# Patient Record
Sex: Male | Born: 1970 | Race: White | Hispanic: No | Marital: Married | Smoking: Never smoker
Health system: Southern US, Community
[De-identification: ages and names within clinical notes are randomized; demographics above are authoritative.]

## PROBLEM LIST (undated history)

## (undated) DIAGNOSIS — E119 Type 2 diabetes mellitus without complications: Secondary | ICD-10-CM

## (undated) HISTORY — PX: KNEE SURGERY: SHX244

## (undated) HISTORY — PX: BACK SURGERY: SHX140

---

## 2000-10-14 ENCOUNTER — Emergency Department (HOSPITAL_COMMUNITY): Admission: EM | Admit: 2000-10-14 | Discharge: 2000-10-14 | Payer: Self-pay | Admitting: Emergency Medicine

## 2000-10-14 ENCOUNTER — Encounter: Payer: Self-pay | Admitting: Emergency Medicine

## 2004-10-17 ENCOUNTER — Ambulatory Visit: Payer: Self-pay

## 2012-09-20 DEATH — deceased

## 2014-05-14 DIAGNOSIS — M12569 Traumatic arthropathy, unspecified knee: Secondary | ICD-10-CM | POA: Insufficient documentation

## 2014-05-14 DIAGNOSIS — S83249A Other tear of medial meniscus, current injury, unspecified knee, initial encounter: Secondary | ICD-10-CM | POA: Insufficient documentation

## 2014-05-24 ENCOUNTER — Ambulatory Visit: Payer: Self-pay | Admitting: Surgery

## 2014-06-20 ENCOUNTER — Ambulatory Visit
Admit: 2014-06-20 | Disposition: A | Discharge status: Home / Self Care | Payer: Self-pay | Attending: Surgery | Admitting: Surgery

## 2014-12-03 ENCOUNTER — Ambulatory Visit (INDEPENDENT_AMBULATORY_CARE_PROVIDER_SITE_OTHER): Payer: 59 | Admitting: Family Medicine

## 2014-12-03 ENCOUNTER — Encounter: Payer: Self-pay | Admitting: Family Medicine

## 2014-12-03 VITALS — BP 120/70 | HR 98 | Temp 98.5°F | Resp 16 | Ht 74.0 in | Wt 310.0 lb

## 2014-12-03 DIAGNOSIS — J40 Bronchitis, not specified as acute or chronic: Secondary | ICD-10-CM | POA: Diagnosis not present

## 2014-12-03 DIAGNOSIS — J301 Allergic rhinitis due to pollen: Secondary | ICD-10-CM

## 2014-12-03 DIAGNOSIS — R059 Cough, unspecified: Secondary | ICD-10-CM

## 2014-12-03 DIAGNOSIS — R05 Cough: Secondary | ICD-10-CM

## 2014-12-03 MED ORDER — AMOXICILLIN-POT CLAVULANATE 875-125 MG PO TABS: 1.0000 | ORAL_TABLET | Freq: Two times a day (BID) | ORAL | Status: DC

## 2014-12-03 MED ORDER — TRAMADOL HCL 50 MG PO TABS: 50.0000 mg | ORAL_TABLET | Freq: Three times a day (TID) | ORAL | Status: DC | PRN

## 2014-12-03 MED ORDER — PREDNISONE 20 MG PO TABS: 20.0000 mg | ORAL_TABLET | Freq: Every day | ORAL | Status: DC

## 2014-12-03 NOTE — Patient Instructions (Signed)

## 2014-12-03 NOTE — Progress Notes (Signed)
Name: Antonio Davies   MRN: [id number]    DOB: [birth year]   Date:12/03/2014       Progress Note  Subjective  Chief Complaint  Chief Complaint  Patient presents with  . Cough    for one week  . Headache    HPI   Bronchitis  Patient presents with a greater than 1 week history of cough productive of purulent sputum. The cough is irritating and keep the patient awake at night. There has no fever or chills.  Over-the-counter meds And completely effective.   Headache   Patient has had some headache associated with his symptomatology. In addition has been some mild myalgias and fatigue. Has been minimal nasal drainage but particularly at night. The cough has been keeping up at night awake at night. This been no hemoptysis no night sweats. No history of TB exposure    History reviewed. No pertinent past medical history.  Social History  Substance Use Topics  . Smoking status: Never Smoker   . Smokeless tobacco: Not on file  . Alcohol Use: 0.0 oz/week    0 Standard drinks or equivalent per week    No current outpatient prescriptions on file.  No Known Allergies  Review of Systems  Constitutional: Positive for fever. Negative for chills and weight loss.  HENT: Negative for congestion, hearing loss, sore throat and tinnitus.        Posterior pharyngeal drainage intermittently at night  Eyes: Negative for blurred vision, double vision and redness.  Respiratory: Positive for cough and sputum production (SLIGHTLY YELLOW AT TIMES). Negative for hemoptysis and shortness of breath.   Cardiovascular: Negative for chest pain, palpitations, orthopnea, claudication and leg swelling.  Gastrointestinal: Negative for heartburn, nausea, vomiting, diarrhea, constipation and blood in stool.  Genitourinary: Negative for dysuria, urgency, frequency and hematuria.  Musculoskeletal: Negative for myalgias, back pain, joint pain, falls and neck pain.  Skin: Negative for itching and rash.   Neurological: Negative for dizziness, tingling, tremors, focal weakness, seizures, loss of consciousness, weakness and headaches.  Endo/Heme/Allergies: Does not bruise/bleed easily.  Psychiatric/Behavioral: Negative for depression and substance abuse. The patient is not nervous/anxious and does not have insomnia.      Objective  Filed Vitals:   12/03/14 1341  BP: 120/70  Pulse: 98  Temp: 98.5 F (36.9 C)  Resp: 16  Height:  (1.88 m)  Weight: 310 lb (140.615 kg)  SpO2: 96%     Physical Exam  Constitutional: He is oriented to person, place, and time and well-developed, well-nourished, and in no distress.  Obese male in no acute distress for frequent coughing  HENT:  Head: Normocephalic.  Eyes: EOM are normal. Pupils are equal, round, and reactive to light.  Neck: Normal range of motion. Neck supple. No thyromegaly present.  Cardiovascular: Normal rate, regular rhythm and normal heart sounds.   No murmur heard. Pulmonary/Chest: Effort normal and breath sounds normal. No respiratory distress. He has no wheezes.  Musculoskeletal: Normal range of motion. He exhibits no edema.  Lymphadenopathy:    He has no cervical adenopathy.  Neurological: He is alert and oriented to person, place, and time. No cranial nerve deficit. Gait normal. Coordination normal.  Skin: Skin is warm and dry. No rash noted.  Psychiatric: Affect and judgment normal.      Assessment & Plan  1. Bronchitis Acute - amoxicillin-clavulanate (AUGMENTIN) 875-125 MG per tablet; Take 1 tablet by mouth 2 (two) times daily.  Dispense: 20 tablet; Refill: 0 - predniSONE (DELTASONE)  20 MG tablet; Take 1 tablet (20 mg total) by mouth daily with breakfast.  Dispense: 10 tablet; Refill: 0  2. Cough Persistent) severe  3. Allergic rhinitis due to pollen Use Flonase which she has at home

## 2014-12-04 ENCOUNTER — Telehealth: Payer: Self-pay

## 2014-12-04 NOTE — Telephone Encounter (Signed)
Pt was here yesterday and was dx with bronchitis. Dr. Thana Ates gave him Tramadol for the pain and told him it would make him drowsy and he would give him a work note if he needed one, he had someone who was available to drive him to work today but, that won't be the case for the rest of the week. He needs a work note for the rest of this week. Please fax it to [phone] saying  "NO OPERATING VEHICLE" and excusing him from work and once this is completed please call him  at [phone]. Thanks

## 2014-12-05 NOTE — Telephone Encounter (Signed)
Thanks

## 2014-12-05 NOTE — Telephone Encounter (Signed)
Note printed and faxed to number left by pt. Called number to inform pt but no answer so left vm.

## 2014-12-05 NOTE — Telephone Encounter (Signed)
ok 

## 2015-02-28 ENCOUNTER — Ambulatory Visit: Payer: Self-pay

## 2015-02-28 ENCOUNTER — Other Ambulatory Visit: Payer: Self-pay | Admitting: Occupational Medicine

## 2015-02-28 DIAGNOSIS — M25562 Pain in left knee: Secondary | ICD-10-CM

## 2016-03-20 IMAGING — MR MRI OF THE RIGHT KNEE WITHOUT CONTRAST
6 series · 35 of 40 positions shown · non-contrast
Comparison: None.

CLINICAL DATA: Right knee pain and swelling.

EXAM:
MRI OF THE RIGHT KNEE WITHOUT CONTRAST
TECHNIQUE: Multiplanar, multisequence MR imaging of the knee was performed. No
intravenous contrast was administered.

[Series 3: PD fat-sat · axial · 3.0mm · 0.50mm/px · z∈[-80,+42]mm · 6 of 38 slices shown (1 of 4)]
[im 1/38]
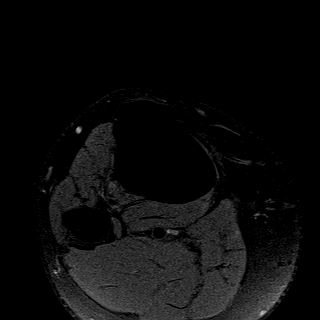
[im 8/38]
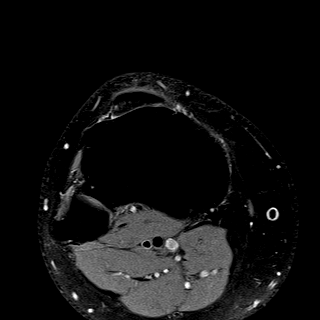
[im 15/38]
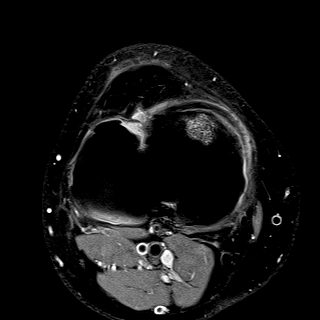
[im 23/38]
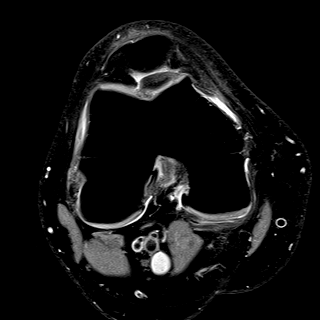
[im 30/38]
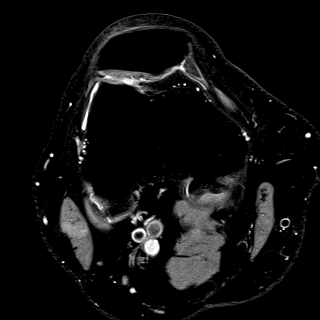
[im 38/38]
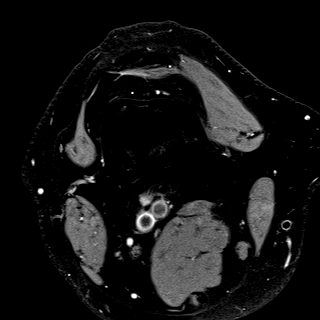

[Series 4: PD fat-sat · sagittal · 3.0mm · 0.50mm/px · 7 of 35 slices shown (2 of 4)]
[im 1/35]
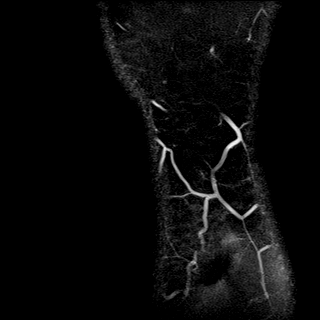
[im 6/35]
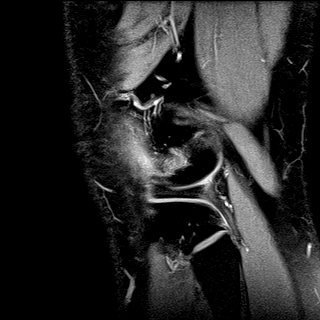
[im 12/35]
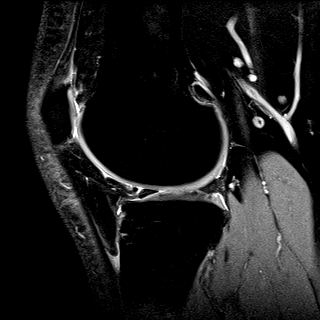
[im 18/35]
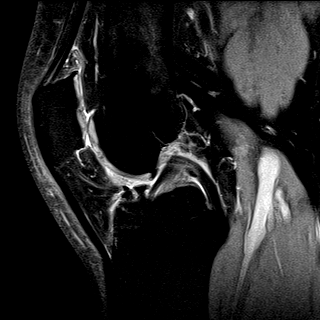
[im 23/35]
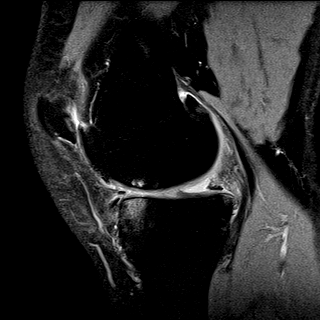
[im 29/35]
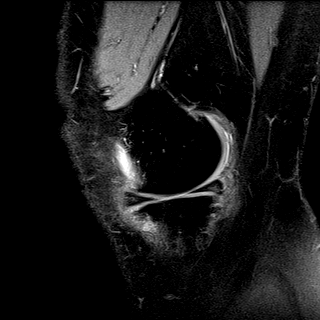
[im 35/35]
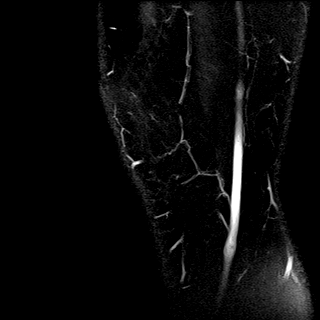

[Series 5: T1 · coronal · 3.0mm · 0.50mm/px · 3 of 39 slices shown]
[im 1/39]
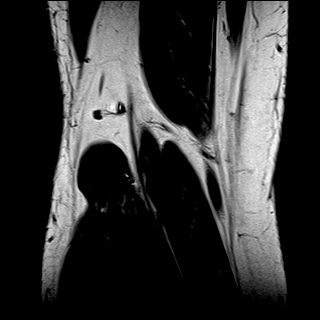
[im 6/39]
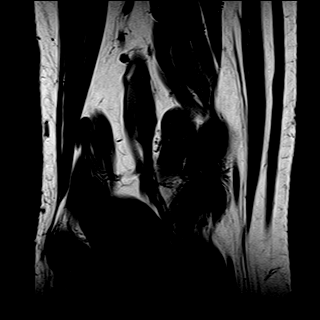
[im 11/39]
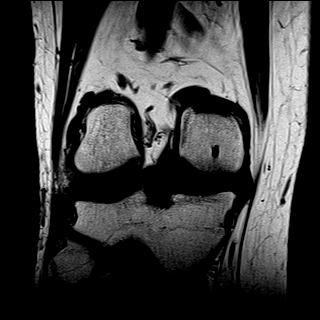

[Series 6: T2 fat-sat · coronal · 3.0mm · 0.31mm/px · 8 of 39 slices shown]
[im 1/39]
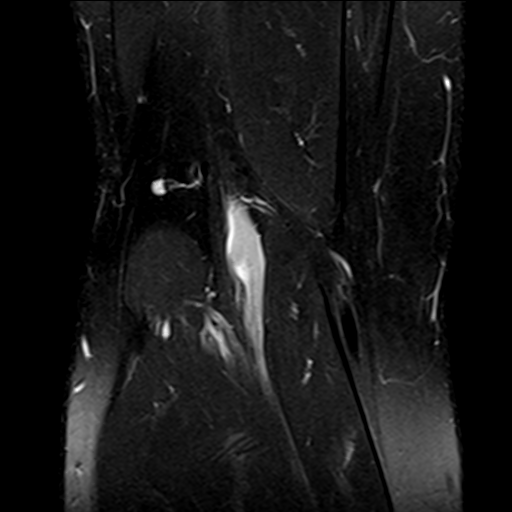
[im 6/39]
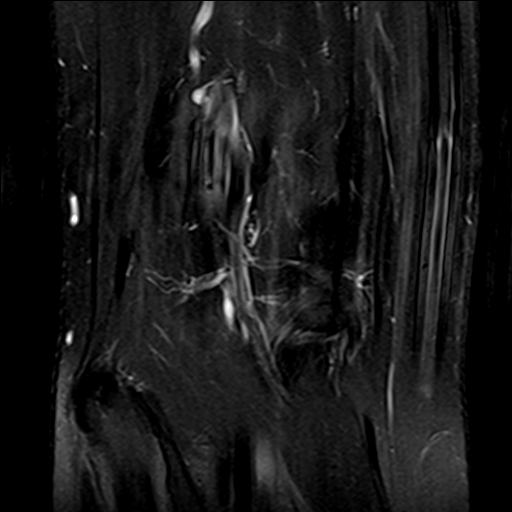
[im 11/39]
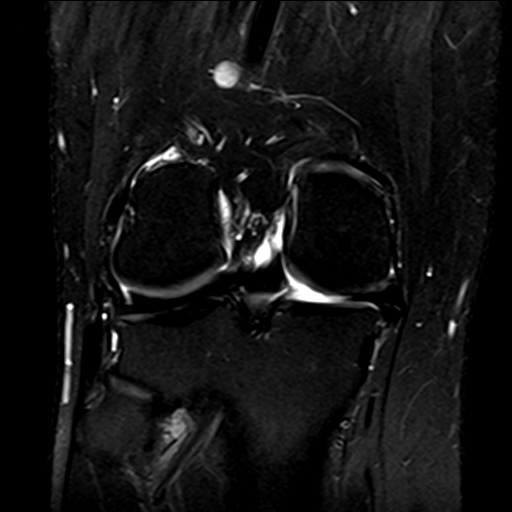
[im 17/39]
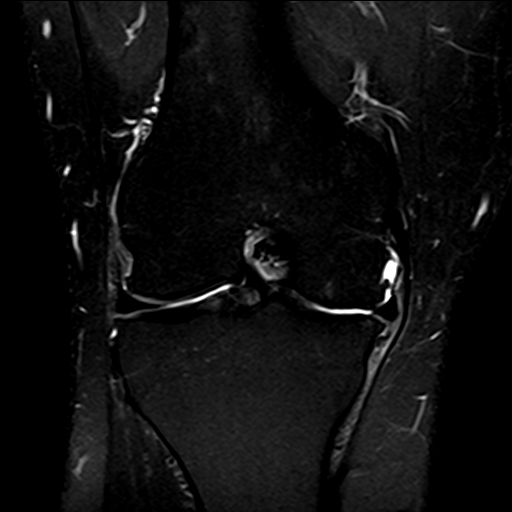
[im 22/39]
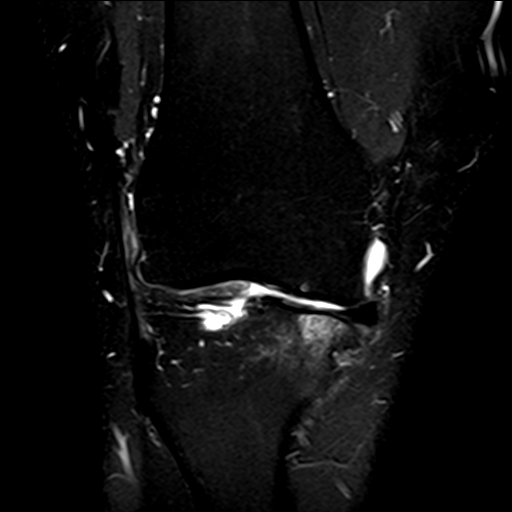
[im 28/39]
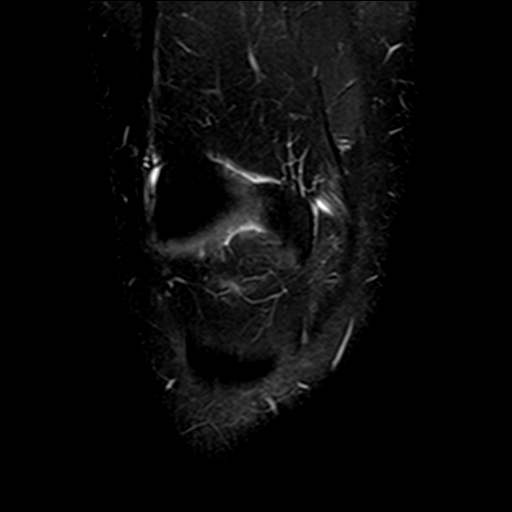
[im 33/39]
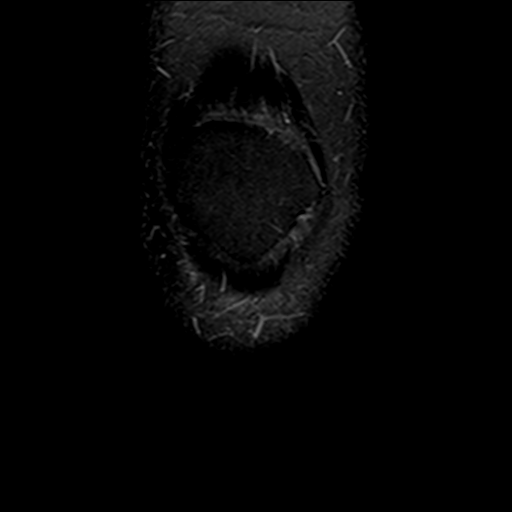
[im 39/39]
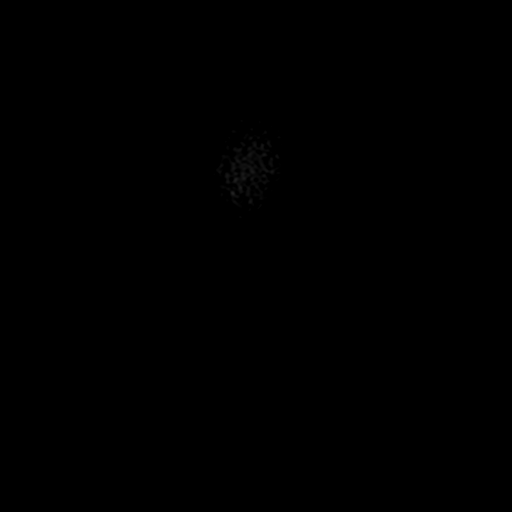

[Series 7: PD fat-sat · coronal · 3.0mm · 0.50mm/px · 8 of 39 slices shown (3 of 4)]
[im 1/39]
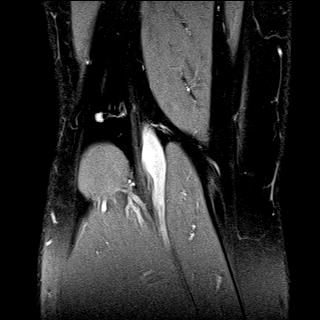
[im 6/39]
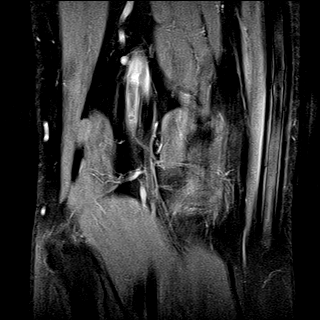
[im 11/39]
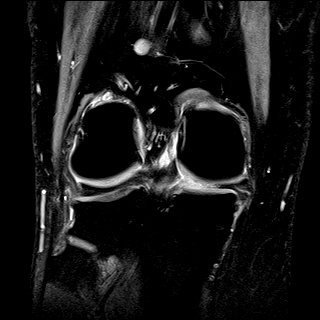
[im 17/39]
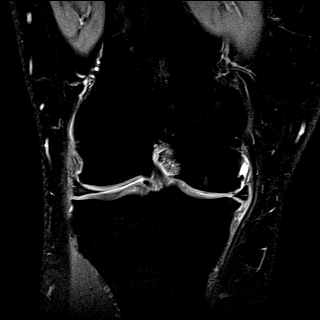
[im 22/39]
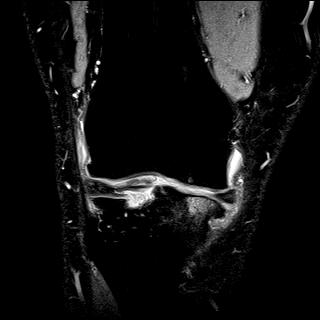
[im 28/39]
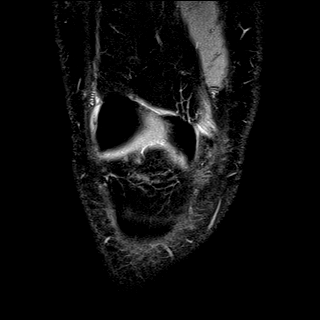
[im 33/39]
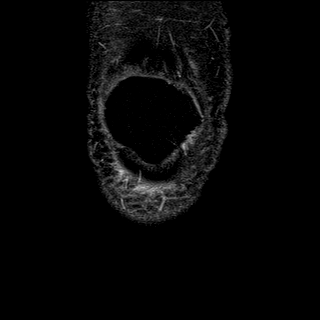
[im 39/39]
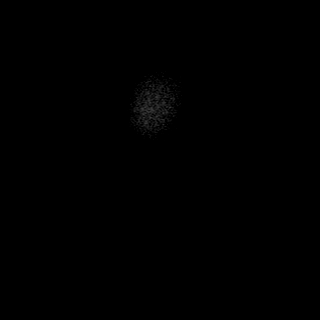

[Series 8: PD fat-sat · coronal · 2.0mm · 0.62mm/px · 3 of 13 slices shown (4 of 4)]
[im 1/13]
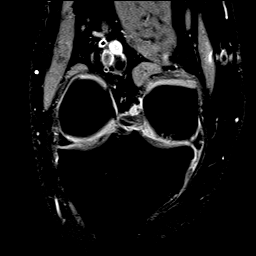
[im 7/13]
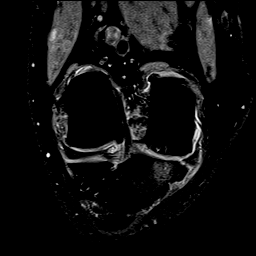
[im 13/13]
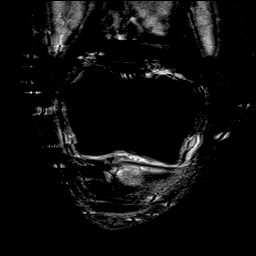

[35 of 40 positions shown; findings below may reference images not displayed]

FINDINGS: MENISCI

Medial meniscus: There is intrinsic degeneration of the midbody and
posterior horn with peripheral subluxation of the meniscus due to
medial joint space narrowing. There is small displaced flap tear at
the meniscal root, best seen on images 21 of series 3, image 10 of
series 7, and images 22 through 24 of series 4.

Lateral meniscus:  Normal.

LIGAMENTS

Cruciates:  Normal.

Collaterals:  Normal.

CARTILAGE

Patellofemoral: Grade 2-3 chondromalacia of the apex and medial
facet of the patella and of the medial and central portions of the
trochlear groove of the distal femur.

Medial: Extensive partial and full-thickness cartilage loss of the
femoral condyle and tibial plateau, most severe at the anterior
aspect of the tibial plateau where there subcortical edema. Multiple
foci of grade 4 chondromalacia of the femoral condyles.

Lateral:  Normal.

Joint:  Normal.  Physiologic amount of joint fluid.

Popliteal Fossa:  Normal.

Extensor Mechanism:  Normal.

Bones: Tricompartmental marginal osteophytes, most prominent in the
medial compartment.
IMPRESSION: Moderate osteoarthritis of the medial compartment with joint space
narrowing and subluxation of the meniscus.

Small displaced flap tear of the root of the posterior horn of the
medial meniscus.

Chondromalacia of the patellofemoral compartment.

## 2017-01-13 ENCOUNTER — Encounter: Payer: 59 | Admitting: Family Medicine

## 2017-03-08 DIAGNOSIS — R079 Chest pain, unspecified: Secondary | ICD-10-CM

## 2017-03-08 DIAGNOSIS — R0789 Other chest pain: Secondary | ICD-10-CM

## 2019-06-02 ENCOUNTER — Other Ambulatory Visit: Payer: Self-pay

## 2019-06-02 ENCOUNTER — Ambulatory Visit: Payer: 59 | Attending: Internal Medicine

## 2019-06-02 DIAGNOSIS — Z23 Encounter for immunization: Secondary | ICD-10-CM

## 2019-06-02 NOTE — Progress Notes (Signed)
   Covid-19 Vaccination Clinic  Name:  Antonio Davies    MRN: [id number] DOB: 11/20/1970  06/02/2019  Mr. [name] was observed post Covid-19 immunization for 15 minutes without incident. He was provided with Vaccine Information Sheet and instruction to access the V-Safe system.   Mr. [name] was instructed to call 911 with any severe reactions post vaccine: Marland Kitchen Difficulty breathing  . Swelling of face and throat  . A fast heartbeat  . A bad rash all over body  . Dizziness and weakness   Immunizations Administered    Name Date Dose VIS Date Route   Pfizer COVID-19 Vaccine 06/02/2019  8:41 AM 0.3 mL 03/03/2019 Intramuscular   Manufacturer: [employer], [employer]   Lot: [id number]   NDC: [address]-2

## 2019-06-27 ENCOUNTER — Ambulatory Visit: Payer: 59 | Attending: Internal Medicine

## 2019-06-27 DIAGNOSIS — Z23 Encounter for immunization: Secondary | ICD-10-CM

## 2019-06-27 NOTE — Progress Notes (Signed)
   Covid-19 Vaccination Clinic  Name:  Antonio Davies    MRN: [id number] DOB: January 07, 1971  06/27/2019  Mr. Antonio Davies was observed post Covid-19 immunization for 15 minutes without incident. He was provided with Vaccine Information Sheet and instruction to access the V-Safe system.   Mr. Antonio Davies was instructed to call 911 with any severe reactions post vaccine: Marland Kitchen Difficulty breathing  . Swelling of face and throat  . A fast heartbeat  . A bad rash all over body  . Dizziness and weakness   Immunizations Administered    Name Date Dose VIS Date Route   Pfizer COVID-19 Vaccine 06/27/2019  1:35 PM 0.3 mL 03/03/2019 Intramuscular   Manufacturer: [employer], [employer]   Lot: [id number]   NDC: [address]-3

## 2022-01-20 ENCOUNTER — Other Ambulatory Visit: Payer: Self-pay

## 2022-01-20 ENCOUNTER — Encounter (HOSPITAL_COMMUNITY): Payer: Self-pay | Admitting: *Deleted

## 2022-01-20 ENCOUNTER — Emergency Department (HOSPITAL_COMMUNITY)
Admission: EM | Admit: 2022-01-20 | Discharge: 2022-01-20 | Disposition: A | Discharge status: Home / Self Care | Payer: Worker's Compensation | Attending: Emergency Medicine | Admitting: Emergency Medicine

## 2022-01-20 ENCOUNTER — Emergency Department (HOSPITAL_COMMUNITY): Payer: Worker's Compensation

## 2022-01-20 DIAGNOSIS — S060X0A Concussion without loss of consciousness, initial encounter: Secondary | ICD-10-CM | POA: Insufficient documentation

## 2022-01-20 DIAGNOSIS — X501XXA Overexertion from prolonged static or awkward postures, initial encounter: Secondary | ICD-10-CM | POA: Insufficient documentation

## 2022-01-20 DIAGNOSIS — W01198A Fall on same level from slipping, tripping and stumbling with subsequent striking against other object, initial encounter: Secondary | ICD-10-CM | POA: Diagnosis not present

## 2022-01-20 DIAGNOSIS — E119 Type 2 diabetes mellitus without complications: Secondary | ICD-10-CM | POA: Diagnosis not present

## 2022-01-20 DIAGNOSIS — M25561 Pain in right knee: Secondary | ICD-10-CM | POA: Diagnosis not present

## 2022-01-20 DIAGNOSIS — S8391XA Sprain of unspecified site of right knee, initial encounter: Secondary | ICD-10-CM | POA: Diagnosis not present

## 2022-01-20 DIAGNOSIS — Y990 Civilian activity done for income or pay: Secondary | ICD-10-CM | POA: Insufficient documentation

## 2022-01-20 DIAGNOSIS — W19XXXA Unspecified fall, initial encounter: Secondary | ICD-10-CM

## 2022-01-20 DIAGNOSIS — R519 Headache, unspecified: Secondary | ICD-10-CM | POA: Diagnosis present

## 2022-01-20 DIAGNOSIS — S0990XA Unspecified injury of head, initial encounter: Secondary | ICD-10-CM

## 2022-01-20 HISTORY — DX: Type 2 diabetes mellitus without complications: E11.9

## 2022-01-20 MED ORDER — IBUPROFEN 600 MG PO TABS: 600.0000 mg | ORAL_TABLET | Freq: Four times a day (QID) | ORAL | 0 refills | Status: DC | PRN

## 2022-01-20 MED ORDER — HYDROCODONE-ACETAMINOPHEN 5-325 MG PO TABS: 1.0000 | ORAL_TABLET | ORAL | 0 refills | Status: DC | PRN

## 2022-01-20 MED ORDER — ACETAMINOPHEN 500 MG PO TABS
1000.0000 mg | ORAL_TABLET | Freq: Once | ORAL | Status: AC
  Administered 2022-01-20: 1000 mg via ORAL
  Filled 2022-01-20: qty 2

## 2022-01-20 NOTE — ED Notes (Signed)
This RN spoke with the pt re: workman's comp, pt does not have paperwork from his place of employment, this RN discussed with the MD re: the pts inquiry about this process and the MD stated that he would need to contact his place of employment for information re: requirements, pt updated and verbalizes understanding

## 2022-01-20 NOTE — ED Triage Notes (Signed)
Pt in via [facility] from work site with report that the pt fell on some grease and hit his head, denies LOC, c/o HA, pt c/o R knee pain, A&O x4, denies taking blood thinners, pt has DM II with CBG 244 pta

## 2022-01-20 NOTE — ED Provider Notes (Signed)
[facility] Provider Note   CSN: [id number] Arrival date & time: 01/20/22  4235     History  Chief Complaint  Patient presents with   Lytle Michaels    JORIEL STREETY is a 51 y.o. male.  Pt is a 51 yo male with a pmhx significant for DM.  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  He did hit the back of his head.  No LOC, but he has a bad headache.  He also twisted his right knee and that is hurting.  Pt was able to walk, but it hurt his right knee.  He is not on blood thinners.  He took ibuprofen pta.  No other injuries.       Home Medications Prior to Admission medications   Medication Sig Start Date End Date Taking? Authorizing Provider  HYDROcodone-acetaminophen (NORCO/VICODIN) 5-325 MG tablet Take 1 tablet by mouth every 4 (four) hours as needed. 01/20/22  Yes Isla Pence, MD  ibuprofen (ADVIL) 600 MG tablet Take 1 tablet (600 mg total) by mouth every 6 (six) hours as needed. 01/20/22  Yes Isla Pence, MD  amoxicillin-clavulanate (AUGMENTIN) 875-125 MG per tablet Take 1 tablet by mouth 2 (two) times daily. 12/03/14   Ashok Norris, MD  predniSONE (DELTASONE) 20 MG tablet Take 1 tablet (20 mg total) by mouth daily with breakfast. 12/03/14   Ashok Norris, MD  traMADol (ULTRAM) 50 MG tablet Take 1 tablet (50 mg total) by mouth every 8 (eight) hours as needed. 12/03/14   Ashok Norris, MD      Allergies    Patient has no known allergies.    Review of Systems   Review of Systems  Musculoskeletal:        Right knee pain  Neurological:  Positive for headaches.  All other systems reviewed and are negative.   Physical Exam Updated Vital Signs BP 138/85 (BP Location: Left Arm)   Pulse 86   Temp 98.1 F (36.7 C) (Oral)   Resp 15   Ht 6\' 3"  (1.905 m)   SpO2 95%   BMI 38.75 kg/m  Physical Exam Vitals and nursing note reviewed.  Constitutional:      Appearance: Normal appearance.  HENT:     Head:  Normocephalic and atraumatic.     Right Ear: External ear normal.     Left Ear: External ear normal.     Nose: Nose normal.     Mouth/Throat:     Mouth: Mucous membranes are moist.     Pharynx: Oropharynx is clear.  Eyes:     Extraocular Movements: Extraocular movements intact.     Conjunctiva/sclera: Conjunctivae normal.     Pupils: Pupils are equal, round, and reactive to light.  Cardiovascular:     Rate and Rhythm: Normal rate and regular rhythm.     Pulses: Normal pulses.     Heart sounds: Normal heart sounds.  Pulmonary:     Effort: Pulmonary effort is normal.     Breath sounds: Normal breath sounds.  Abdominal:     General: Abdomen is flat. Bowel sounds are normal.     Palpations: Abdomen is soft.  Musculoskeletal:     Cervical back: Normal range of motion.       Legs:  Skin:    General: Skin is warm.     Capillary Refill: Capillary refill takes less than 2 seconds.  Neurological:     General: No focal deficit present.  Mental Status: He is alert and oriented to person, place, and time.  Psychiatric:        Mood and Affect: Mood normal.        Behavior: Behavior normal.     ED Results / Procedures / Treatments   Labs (all labs ordered are listed, but only abnormal results are displayed) Labs Reviewed - No data to display  EKG None  Radiology CT Head Wo Contrast  Result Date: 01/20/2022 CLINICAL DATA:  Headache after fall. EXAM: CT HEAD WITHOUT CONTRAST TECHNIQUE: Contiguous axial images were obtained from the base of the skull through the vertex without intravenous contrast. RADIATION DOSE REDUCTION: This exam was performed according to the departmental dose-optimization program which includes automated exposure control, adjustment of the mA and/or kV according to patient size and/or use of iterative reconstruction technique. COMPARISON:  None Available. FINDINGS: Brain: No evidence of acute infarction, hemorrhage, hydrocephalus, extra-axial collection or  mass lesion/mass effect. Vascular: No hyperdense vessel or unexpected calcification. Skull: Normal. Negative for fracture or focal lesion. Sinuses/Orbits: No acute finding. Other: None. IMPRESSION: 1. No acute intracranial abnormality. Electronically Signed   By: Obie Dredge M.D.   On: 01/20/2022 08:11   DG Knee Complete 4 Views Right  Result Date: 01/20/2022 CLINICAL DATA:  Post fall this morning, now with knee pain. EXAM: RIGHT KNEE - COMPLETE 4+ VIEW COMPARISON:  None Available. FINDINGS: No fracture or dislocation. No joint effusion or evidence of lipohemarthrosis. 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 There is minimal spurring of the tibial spines. No evidence of chondrocalcinosis. Tiny (2 mm) peripherally corticated ossicle adjacent to the anterior aspect of the tibial tuberosity likely represents sequela of remote avulsive injury. Regional soft tissues appear normal. IMPRESSION: 1. No acute findings. 2. 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 Electronically Signed   By: Simonne Come M.D.   On: 01/20/2022 07:43    Procedures Procedures    Medications Ordered in ED Medications - No data to display  ED Course/ Medical Decision Making/ A&P                           Medical Decision Making Amount and/or Complexity of Data Reviewed Radiology: ordered.  Risk Prescription drug management.   This patient presents to the ED for concern of fall, this involves an extensive number of treatment options, and is a complaint that carries with it a high risk of complications and morbidity.  The differential diagnosis includes multiple trauma   Co morbidities that complicate the patient evaluation  DM   Additional history obtained:  Additional history obtained from epic chart  review External records from outside source obtained and reviewed including EMS report   Imaging Studies ordered:  I ordered imaging studies including CT head and right knee x-ray  I independently visualized and interpreted imaging which showed  R knee: IMPRESSION:  1. No acute findings.  2. Moderate to severe tricompartmental degenerative change of the  knee, worse within the medial compartment.  CT head: IMPRESSION:  1. No acute intracranial abnormality.   I agree with the radiologist interpretation   Cardiac Monitoring:  The patient was maintained on a cardiac monitor.  I personally viewed and interpreted the cardiac monitored which showed an underlying rhythm of: nsr   Medicines ordered and prescription drug management:  Pt did not want additional meds while here. I have reviewed the patients home medicines and have made adjustments as needed   Test Considered:  ct   Critical Interventions:  ct    Problem List / ED Course:  R knee pain:  pt placed in a knee immobilizer for comfort.  He has an orthopedist and is to f/u with him. Head injury:  Mild concussion.  No internal injury.  Pt given the number to the concussion clinic.   Reevaluation:  After the interventions noted above, I reevaluated the patient and found that they have :improved   Social Determinants of Health:  Lives at home with wife    Dispostion:  After consideration of the diagnostic results and the patients response to treatment, I feel that the patent would benefit from discharge with outpatient f/u.          Final Clinical Impression(s) / ED Diagnoses Final diagnoses:  Fall, initial encounter  Sprain of right knee, unspecified ligament, initial encounter  Minor head injury, initial encounter    Rx / DC Orders ED Discharge Orders          Ordered    HYDROcodone-acetaminophen (NORCO/VICODIN) 5-325 MG tablet  Every 4 hours PRN        01/20/22 0826    ibuprofen (ADVIL)  600 MG tablet  Every 6 hours PRN        01/20/22 0826              Jacalyn Lefevre, MD 01/20/22 [phone]

## 2022-11-20 ENCOUNTER — Ambulatory Visit: Payer: BC Managed Care – PPO | Admitting: Urology

## 2022-11-20 ENCOUNTER — Encounter: Payer: Self-pay | Admitting: Urology

## 2022-11-20 VITALS — BP 120/79 | HR 71 | Ht 75.0 in | Wt 250.0 lb

## 2022-11-20 DIAGNOSIS — E291 Testicular hypofunction: Secondary | ICD-10-CM

## 2022-11-20 DIAGNOSIS — R7989 Other specified abnormal findings of blood chemistry: Secondary | ICD-10-CM

## 2022-11-20 NOTE — Progress Notes (Signed)
I, Maysun Anabel Bene, acting as a scribe for Riki Altes, MD., have documented all relevant documentation on the behalf of Riki Altes, MD, as directed by Riki Altes, MD while in the presence of Riki Altes, MD.  11/20/2022 3:05 PM   Ines Bloomer Candy Sledge Jan 08, 1971 [id number]  Referring provider: Janece Canterbury, MD [address] [facility] [address],  [place] [address]  Chief Complaint  Patient presents with   New Patient (Initial Visit)    Low testosterone    HPI: Antonio Davies is a 52 y.o. male here  for evaluation of hypogonadism.  2+ year history of low energy, tiredness, fatigue, decreased libido, and mild erectile dysfunction. A testosterone level drawn 07/2020 was low at 238 ng/dL; a repeat testosterone level 07/20/22 was low at 231 ng/dL.  He states his PCP did not recommend TRT due to concerns of causing prostate cancer. PSA checked regularly and was 0.57 03/11/22 LH has not been checked.    PMH: Past Medical History:  Diagnosis Date   Diabetes [facility])     Surgical History: Past Surgical History:  Procedure Laterality Date   BACK SURGERY     KNEE SURGERY Right     Home Medications:  Allergies as of 11/20/2022   No Known Allergies      Medication List        Accurate as of November 20, 2022  3:05 PM. If you have any questions, ask your nurse or doctor.          STOP taking these medications    amoxicillin-clavulanate 875-125 MG tablet Commonly known as: AUGMENTIN Stopped by: Riki Altes   HYDROcodone-acetaminophen 5-325 MG tablet Commonly known as: NORCO/VICODIN Stopped by: Riki Altes   ibuprofen 600 MG tablet Commonly known as: ADVIL Stopped by: Riki Altes   predniSONE 20 MG tablet Commonly known as: DELTASONE Stopped by: Riki Altes   traMADol 50 MG tablet Commonly known as: ULTRAM Stopped by: Riki Altes       TAKE these medications    atorvastatin 10 MG tablet Commonly known as: LIPITOR Take 1  tablet by mouth daily.   Dexcom G7 Sensor Misc APPLY SENSOR EVERY 7 DAYS   famotidine 20 MG tablet Commonly known as: PEPCID Take 20 mg by mouth 2 (two) times daily.   Jardiance 25 MG Tabs tablet Generic drug: empagliflozin Take 25 mg by mouth daily.   meloxicam 15 MG tablet Commonly known as: MOBIC Take 15 mg by mouth daily.   metFORMIN 500 MG 24 hr tablet Commonly known as: GLUCOPHAGE-XR Take 1,500 mg by mouth at bedtime.   Mounjaro 7.5 MG/0.5ML Pen Generic drug: tirzepatide Inject 7.5 mg into the skin once a week.        Allergies: No Known Allergies  Family History: Family History  Problem Relation Age of Onset   Cirrhosis Mother    Kidney disease Father     Social History:  reports that he has never smoked. He has never been exposed to tobacco smoke. He has never used smokeless tobacco. He reports that he does not currently use alcohol. He reports that he does not use drugs.   Physical Exam: BP 120/79   Pulse 71   Ht 6\' 3"  (1.905 m)   Wt 250 lb (113.4 kg)   BMI 31.25 kg/m   Constitutional:  Alert and oriented, No acute distress. HEENT: [place] AT, moist mucus membranes.  Trachea midline, no masses.  Cardiovascular: No clubbing, cyanosis, or edema. Respiratory: Normal respiratory effort, no increased work of breathing. GI: Abdomen is soft, nontender, nondistended, no abdominal masses GU: Phallus without lesions, testes descended bilaterally without masses or tenderness estimated size 20 cc bilaterally.  Skin: No rashes, bruises or suspicious lesions. Neurologic: Grossly intact, no focal deficits, moving all 4 extremities. Psychiatric: Normal mood and affect.   Assessment & Plan:    1. Hypogonadism We discussed the diagnosis of testosterone is based on 2 abnormal total testosterone levels drawn in the a.m. admitted with signs and symptoms of low testosterone. We discussed various forms of testosterone placement including topical preparations, intramuscular  injections, subcutaneous injections, subcutaneous pellet implantation and oral testosterone.  Pros and cons of each form were discussed.  The risk of transference of topical testosterone. I had an extensive discussion regarding testosterone replacement therapy including the following: Treatment may result in improvements in erectile function, low sex drive, anemia, bone mineral density, lean body mass, and depressive symptoms; evidence is inconclusive whether testosterone therapy improves cognitive function, measures of diabetes, energy, fatigue, lipid profiles, and quality of life measures; there is no conclusive evidence linking testosterone therapy to the development of prostate cancer; there is no definitive evidence linking testosterone therapy to a higher incidence of venothrombolic events; at the present time it cannot be stated definitively whether testosterone therapy increases or decreases the risk of cardiovascular events including myocardial infarction and stroke. Potential side effects were discussed including erythrocytosis, gynecomastia.  The need for regular monitoring of testosterone levels and hematocrit was discussed.  LH drawn today and he will be notified with the results. If no significant abnormalities that would suggest hypogonadotropic hypogonadism, he desires to start testosterone replacement  Interested in [place] if covered by [facility] [address] [place], [place] [address] [phone]

## 2022-11-21 ENCOUNTER — Encounter: Payer: Self-pay | Admitting: Urology

## 2022-11-21 LAB — LUTEINIZING HORMONE: LH: 3.9 m[IU]/mL (ref 1.7–8.6)

## 2022-11-23 ENCOUNTER — Other Ambulatory Visit: Payer: Self-pay | Admitting: Urology

## 2022-11-23 MED ORDER — XYOSTED 75 MG/0.5ML ~~LOC~~ SOAJ: 75.0000 mg | SUBCUTANEOUS | 2 refills | Status: DC

## 2022-11-24 ENCOUNTER — Telehealth: Payer: Self-pay | Admitting: *Deleted

## 2022-11-24 NOTE — Telephone Encounter (Signed)
-----   Message from Verna Czech [facility] sent at 11/23/2022 10:47 AM EDT ----- LH level was normal.  Rx Xyosted sent to [employer] to obtain prior authorization

## 2022-11-25 NOTE — Telephone Encounter (Signed)
Notified patient as instructed, patient pleased °

## 2022-12-08 ENCOUNTER — Telehealth: Payer: Self-pay | Admitting: Urology

## 2022-12-08 NOTE — Telephone Encounter (Signed)
Pt would like for Dr Lonna Cobb to send in a different script.  He said the one he did first, the insurance wasn't going to pay good enough.  Stoioff said there were about 4 difference options.

## 2022-12-10 ENCOUNTER — Encounter: Payer: Self-pay | Admitting: *Deleted

## 2022-12-10 ENCOUNTER — Other Ambulatory Visit: Payer: Self-pay | Admitting: Urology

## 2022-12-10 MED ORDER — TESTOSTERONE CYPIONATE 200 MG/ML IM SOLN: 200.0000 mg | INTRAMUSCULAR | 0 refills | Status: DC

## 2022-12-10 NOTE — Telephone Encounter (Signed)
Notified patient by my chart and left message on voice mail  as instructed, Patient to schedule a teaching

## 2022-12-10 NOTE — Telephone Encounter (Signed)
Sent patient a my chart message  Would he prefer a topical gel or intramuscular injection?  Both are effective

## 2022-12-15 ENCOUNTER — Other Ambulatory Visit: Payer: Self-pay | Admitting: *Deleted

## 2022-12-18 MED ORDER — TESTOSTERONE CYPIONATE 200 MG/ML IM SOLN: 200.0000 mg | INTRAMUSCULAR | 0 refills | Status: DC

## 2022-12-22 ENCOUNTER — Other Ambulatory Visit: Payer: Self-pay | Admitting: *Deleted

## 2022-12-22 MED ORDER — "BD SAFETYGLIDE NEEDLE 18G X 1-1/2"" MISC": 0 refills | Status: AC

## 2022-12-22 MED ORDER — "BD LUER-LOK SYRINGE 21G X 1-1/2"" 3 ML MISC": 0 refills | Status: AC

## 2023-01-08 ENCOUNTER — Encounter: Payer: Self-pay | Admitting: Physician Assistant

## 2023-01-08 ENCOUNTER — Ambulatory Visit: Payer: BC Managed Care – PPO | Admitting: Physician Assistant

## 2023-01-08 VITALS — BP 114/79 | HR 85

## 2023-01-08 DIAGNOSIS — E291 Testicular hypofunction: Secondary | ICD-10-CM

## 2023-01-08 NOTE — Progress Notes (Signed)
Patient presents today for Testosterone injection teaching. Patient was instructed on how to properly use the 18guage needle to draw up 1cc of the testosterone, into 3cc syringe then changed the needle to the 21guage for injection. I then cleaned the vastus lateralis with an alcohol swab and injected the site with bevel up.   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  Patient dose: 200mg  (1mL) Lot [id number] Expiration date: 07/2025 Location: Right  Patient verbalized understanding current dose is 1ml every 14days unless instructed by a provider.  Patient tolerated well.  Patient understood how to dispose of sharps properly and store medication.   Performed by: Carman Ching, PA-C

## 2023-01-08 NOTE — Patient Instructions (Addendum)
Supplies you will need:  Testosterone from the pharmacy Alcohol swabs 3mL Luer Lock tip syringes 18G needles to draw up the medicine 21G needles to inject the medicine    Instructions for disposing of "Sharps" Disposal of syringes and other sharp objects is monitored by the Dietitian (EPA). It is important to dispose of them properly for your safety and for the safety of others.  The EPA promotes all recycling activities, and therefore encourages you to discard medical waste sharps in sturdy, non-recyclable containers, when possible.  Your stat or community environmental programs may have other requirements or suggestions for disposing of your medical waste.  You should contact your local EPA office for any information you may need.  What container should be used Place needles, syringes, lancets and other sharp objects in a hard plastic or metal container with a screw on or tightly secured lid.  Many containers found in the household will do, or you may purchase containers specifically designed for disposal of medical wast sharps.  If a recyclable container is used to dispose of medical waste sharps, make sure that you don't mix the container with other materials to be recycled.  Since the sharps impair a containers recyclability, a container holding your medical waste sharps properly belongs with the regular household trash.  You should label the container "Not for Recycling".  In addition, make sure your sharps container is made of non breakable material and has a lid that can be securely closed (screwed on or tightly secured).  Before discarding a container, be sure to reinforce the lid with heavy-duty tape.  Do not put sharpe objects in a container you plan to recycle or return to a store, and do not use glass or clear plastic containers (see additional information below).  Finally, make sure that you keep all containers with sharp objects out of the reach of children and  pets.  Your home care provider may deliver a sharps container with your medical supplies.  If so place all needles, syringes and lancets in this container and notify the company when the container is approximately 75% full.  Your home care provider will arrange for pickup of the container.  For your safety, do NOT bring your container to the hospital for disposal.        Tips for minimizing injection pain- -inject medicine that is at room temperature -remove all air bubbles from the syringe before injection -wait until the topical alcohol has evaporated before injecting -keep muscles in the injection area relaxed -break through the skin quickly -don't change the direction of the needle as it goes in or comes out -do not reuse disposable needles

## 2023-03-03 ENCOUNTER — Other Ambulatory Visit: Payer: BC Managed Care – PPO

## 2023-03-03 DIAGNOSIS — E291 Testicular hypofunction: Secondary | ICD-10-CM

## 2023-03-04 ENCOUNTER — Other Ambulatory Visit: Payer: Self-pay | Admitting: Urology

## 2023-03-04 LAB — TESTOSTERONE: Testosterone: 456 ng/dL (ref 264–916)

## 2023-03-04 LAB — HEMOGLOBIN AND HEMATOCRIT, BLOOD
Hematocrit: 50 % (ref 37.5–51.0)
Hemoglobin: 16.3 g/dL (ref 13.0–17.7)

## 2023-03-04 LAB — PSA: Prostate Specific Ag, Serum: 1.1 ng/mL (ref 0.0–4.0)

## 2023-03-05 MED ORDER — TESTOSTERONE CYPIONATE 200 MG/ML IM SOLN: 200.0000 mg | INTRAMUSCULAR | 0 refills | Status: DC

## 2023-05-11 ENCOUNTER — Other Ambulatory Visit: Payer: Self-pay | Admitting: *Deleted

## 2023-05-11 DIAGNOSIS — E291 Testicular hypofunction: Secondary | ICD-10-CM

## 2023-05-11 DIAGNOSIS — R7989 Other specified abnormal findings of blood chemistry: Secondary | ICD-10-CM

## 2023-05-12 ENCOUNTER — Other Ambulatory Visit: Payer: Self-pay

## 2023-05-12 DIAGNOSIS — R7989 Other specified abnormal findings of blood chemistry: Secondary | ICD-10-CM

## 2023-05-13 ENCOUNTER — Other Ambulatory Visit: Payer: Self-pay

## 2023-05-13 LAB — TESTOSTERONE: Testosterone: 428 ng/dL (ref 264–916)

## 2023-05-13 LAB — HEMOGLOBIN AND HEMATOCRIT, BLOOD
Hematocrit: 50 % (ref 37.5–51.0)
Hemoglobin: 17 g/dL (ref 13.0–17.7)

## 2023-05-21 ENCOUNTER — Ambulatory Visit: Payer: 59 | Admitting: Urology

## 2023-05-21 VITALS — BP 142/88 | HR 94 | Ht 74.0 in | Wt 255.0 lb

## 2023-05-21 DIAGNOSIS — E291 Testicular hypofunction: Secondary | ICD-10-CM | POA: Diagnosis not present

## 2023-05-21 NOTE — Progress Notes (Signed)
 I, Antonio Davies, acting as a scribe for Riki Altes, MD., have documented all relevant documentation on the behalf of Riki Altes, MD, as directed by Riki Altes, MD while in the presence of Riki Altes, MD.  05/21/2023 11:07 AM   Antonio Davies 1970/11/30 [id number]  Referring provider: Janece Canterbury, MD [address] [facility] [address],  [place] [address]  Chief Complaint  Patient presents with   Follow-up   Urological history 1. Hypogonadism Initial visit 11/20/22 with a two-year history of low energy, tiredness, fatigue, decreased libido, and mild ED. Testosterone levels 238 and 231; LH 3.9 Started TRT 200 mg Q2 weeks  HPI: Antonio Davies is a 53 y.o. male presents for a follow-up visit.   Since starting TRT, he has noted a mild improvement in his libido but still experiences some tiredness/fatigue. Labs on 05/12/23 show testosterone level 428, H/H 17.0/50; PSA 03/03/23 1.1   PMH: Past Medical History:  Diagnosis Date   Diabetes [facility])     Surgical History: Past Surgical History:  Procedure Laterality Date   BACK SURGERY     KNEE SURGERY Right     Home Medications:  Allergies as of 05/21/2023   No Known Allergies      Medication List        Accurate as of May 21, 2023 11:07 AM. If you have any questions, ask your nurse or doctor.          atorvastatin 10 MG tablet Commonly known as: LIPITOR Take 1 tablet by mouth daily.   B-D 3CC LUER-LOK SYR 21GX1-1/2 21G X 1-1/2" 3 ML Misc Generic drug: SYRINGE-NEEDLE (DISP) 3 ML Use this needle to injection   BD SafetyGlide Needle 18G X 1-1/2" Misc Generic drug: NEEDLE (DISP) 18 G Use this  pull up medication   Dexcom G7 Sensor Misc APPLY SENSOR EVERY 7 DAYS   famotidine 20 MG tablet Commonly known as: PEPCID Take 20 mg by mouth 2 (two) times daily.   Jardiance 25 MG Tabs tablet Generic drug: empagliflozin Take 25 mg by mouth daily.   meloxicam 15 MG tablet Commonly known  as: MOBIC Take 15 mg by mouth daily.   metFORMIN 500 MG 24 hr tablet Commonly known as: GLUCOPHAGE-XR Take 1,500 mg by mouth at bedtime.   Mounjaro 7.5 MG/0.5ML Pen Generic drug: tirzepatide Inject 7.5 mg into the skin once a week.   testosterone cypionate 200 MG/ML injection Commonly known as: DEPOTESTOSTERONE CYPIONATE Inject 1 mL (200 mg total) into the muscle every 14 (fourteen) days.        Allergies: No Known Allergies  Family History: Family History  Problem Relation Age of Onset   Cirrhosis Mother    Kidney disease Father     Social History:  reports that he has never smoked. He has never been exposed to tobacco smoke. He has never used smokeless tobacco. He reports that he does not currently use alcohol. He reports that he does not use drugs.   Physical Exam: BP (!) 142/88   Pulse 94   Ht 6\' 2"  (1.88 m)   Wt 255 lb (115.7 kg)   BMI 32.74 kg/m   Constitutional:  Alert and oriented, No acute distress. HEENT: [place] AT Respiratory: Normal respiratory effort, no increased work of breathing. Psychiatric: Normal mood and affect.   Assessment & Plan:    1. Hypogonadism Mild improvement in symptoms. The patient's last two testosterone levels were in the low  to mid-400 range. Increase TRT dose to 300 mg every two weeks. Schedule a follow-up lab visit in two months for a mid-cycle testosterone level assessment. His wife works for [employer] and he is being seen here out of network and was inquiring about transfer of care to [facility] period. Referral was placed.  I have reviewed the above documentation for accuracy and completeness, and I agree with the above.   Riki Altes, MD  [facility] [address] [place], [place] [address] [phone]

## 2023-05-22 ENCOUNTER — Encounter: Payer: Self-pay | Admitting: Urology

## 2023-07-13 ENCOUNTER — Other Ambulatory Visit: Payer: PRIVATE HEALTH INSURANCE

## 2023-07-15 ENCOUNTER — Encounter: Payer: Self-pay | Admitting: Urology

## 2023-09-04 ENCOUNTER — Other Ambulatory Visit: Payer: Self-pay | Admitting: Urology
